# Patient Record
Sex: Female | Born: 1990 | Race: White | Hispanic: No | Marital: Single | Smoking: Never smoker
Health system: Southern US, Community
[De-identification: ages and names within clinical notes are randomized; demographics above are authoritative.]

## PROBLEM LIST (undated history)

## (undated) ENCOUNTER — Ambulatory Visit (HOSPITAL_COMMUNITY): Admission: EM | Payer: No Typology Code available for payment source

---

## 2020-01-20 ENCOUNTER — Emergency Department (HOSPITAL_COMMUNITY): Payer: No Typology Code available for payment source

## 2020-01-20 ENCOUNTER — Encounter (HOSPITAL_COMMUNITY): Payer: Self-pay

## 2020-01-20 ENCOUNTER — Emergency Department (HOSPITAL_COMMUNITY)
Admission: EM | Admit: 2020-01-20 | Discharge: 2020-01-20 | Disposition: A | Discharge status: Home / Self Care | Payer: No Typology Code available for payment source | Attending: Emergency Medicine | Admitting: Emergency Medicine

## 2020-01-20 ENCOUNTER — Other Ambulatory Visit: Payer: Self-pay

## 2020-01-20 DIAGNOSIS — N760 Acute vaginitis: Secondary | ICD-10-CM | POA: Insufficient documentation

## 2020-01-20 DIAGNOSIS — R1031 Right lower quadrant pain: Secondary | ICD-10-CM | POA: Diagnosis present

## 2020-01-20 DIAGNOSIS — B9689 Other specified bacterial agents as the cause of diseases classified elsewhere: Secondary | ICD-10-CM

## 2020-01-20 DIAGNOSIS — Z79899 Other long term (current) drug therapy: Secondary | ICD-10-CM | POA: Diagnosis not present

## 2020-01-20 DIAGNOSIS — N10 Acute tubulo-interstitial nephritis: Secondary | ICD-10-CM | POA: Insufficient documentation

## 2020-01-20 DIAGNOSIS — R1011 Right upper quadrant pain: Secondary | ICD-10-CM

## 2020-01-20 DIAGNOSIS — N12 Tubulo-interstitial nephritis, not specified as acute or chronic: Secondary | ICD-10-CM

## 2020-01-20 LAB — URINALYSIS, ROUTINE W REFLEX MICROSCOPIC
Bacteria, UA: NONE SEEN
Bilirubin Urine: NEGATIVE
Glucose, UA: NEGATIVE mg/dL
Ketones, ur: 20 mg/dL — AB
Nitrite: NEGATIVE
Protein, ur: 30 mg/dL — AB
Specific Gravity, Urine: 1.02 (ref 1.005–1.030)
pH: 6 (ref 5.0–8.0)

## 2020-01-20 LAB — CBC WITH DIFFERENTIAL/PLATELET
Abs Immature Granulocytes: 0.08 10*3/uL — ABNORMAL HIGH (ref 0.00–0.07)
Basophils Absolute: 0 10*3/uL (ref 0.0–0.1)
Basophils Relative: 0 %
Eosinophils Absolute: 0 10*3/uL (ref 0.0–0.5)
Eosinophils Relative: 0 %
HCT: 42 % (ref 36.0–46.0)
Hemoglobin: 13.8 g/dL (ref 12.0–15.0)
Immature Granulocytes: 1 %
Lymphocytes Relative: 14 %
Lymphs Abs: 1.9 10*3/uL (ref 0.7–4.0)
MCH: 32 pg (ref 26.0–34.0)
MCHC: 32.9 g/dL (ref 30.0–36.0)
MCV: 97.4 fL (ref 80.0–100.0)
Monocytes Absolute: 1.3 10*3/uL — ABNORMAL HIGH (ref 0.1–1.0)
Monocytes Relative: 10 %
Neutro Abs: 10.5 10*3/uL — ABNORMAL HIGH (ref 1.7–7.7)
Neutrophils Relative %: 75 %
Platelets: 152 10*3/uL (ref 150–400)
RBC: 4.31 MIL/uL (ref 3.87–5.11)
RDW: 12.5 % (ref 11.5–15.5)
WBC: 13.8 10*3/uL — ABNORMAL HIGH (ref 4.0–10.5)
nRBC: 0 % (ref 0.0–0.2)

## 2020-01-20 LAB — WET PREP, GENITAL
Sperm: NONE SEEN
Trich, Wet Prep: NONE SEEN
Yeast Wet Prep HPF POC: NONE SEEN

## 2020-01-20 LAB — I-STAT BETA HCG BLOOD, ED (MC, WL, AP ONLY): I-stat hCG, quantitative: <5 m[IU]/mL (ref <5)

## 2020-01-20 LAB — COMPREHENSIVE METABOLIC PANEL
ALT: 16 U/L (ref 0–44)
AST: 20 U/L (ref 15–41)
Albumin: 3.7 g/dL (ref 3.5–5.0)
Alkaline Phosphatase: 43 U/L (ref 38–126)
Anion gap: 8 (ref 5–15)
BUN: 13 mg/dL (ref 6–20)
CO2: 25 mmol/L (ref 22–32)
Calcium: 8 mg/dL — ABNORMAL LOW (ref 8.9–10.3)
Chloride: 101 mmol/L (ref 98–111)
Creatinine, Ser: 1.1 mg/dL — ABNORMAL HIGH (ref 0.44–1.00)
GFR calc Af Amer: >60 mL/min (ref >60)
GFR calc non Af Amer: >60 mL/min (ref >60)
Glucose, Bld: 105 mg/dL — ABNORMAL HIGH (ref 70–99)
Potassium: 3.6 mmol/L (ref 3.5–5.1)
Sodium: 134 mmol/L — ABNORMAL LOW (ref 135–145)
Total Bilirubin: 0.8 mg/dL (ref 0.3–1.2)
Total Protein: 6.8 g/dL (ref 6.5–8.1)

## 2020-01-20 LAB — LIPASE, BLOOD: Lipase: 40 U/L (ref 11–51)

## 2020-01-20 MED ORDER — ACETAMINOPHEN 325 MG PO TABS: 650.0000 mg | ORAL_TABLET | Freq: Once | ORAL | Status: DC

## 2020-01-20 MED ORDER — SODIUM CHLORIDE 0.9 % IV SOLN
2.0000 g | Freq: Once | INTRAVENOUS | Status: AC
  Administered 2020-01-20: 2 g via INTRAVENOUS
  Filled 2020-01-20: qty 20

## 2020-01-20 MED ORDER — MORPHINE SULFATE (PF) 4 MG/ML IV SOLN
4.0000 mg | Freq: Once | INTRAVENOUS | Status: AC
  Administered 2020-01-20: 08:00:00 4 mg via INTRAVENOUS
  Filled 2020-01-20: qty 1

## 2020-01-20 MED ORDER — DIPHENHYDRAMINE HCL 25 MG PO CAPS
25.0000 mg | ORAL_CAPSULE | Freq: Once | ORAL | Status: AC
  Administered 2020-01-20: 25 mg via ORAL
  Filled 2020-01-20: qty 1

## 2020-01-20 MED ORDER — CIPROFLOXACIN HCL 500 MG PO TABS: 500.0000 mg | ORAL_TABLET | Freq: Two times a day (BID) | ORAL | 0 refills | Status: AC

## 2020-01-20 MED ORDER — METRONIDAZOLE 500 MG PO TABS: 500.0000 mg | ORAL_TABLET | Freq: Two times a day (BID) | ORAL | 0 refills | Status: AC

## 2020-01-20 MED ORDER — SODIUM CHLORIDE 0.9 % IV BOLUS
1000.0000 mL | Freq: Once | INTRAVENOUS | Status: AC
  Administered 2020-01-20: 1000 mL via INTRAVENOUS

## 2020-01-20 MED ORDER — SODIUM CHLORIDE 0.9 % IV SOLN: Freq: Once | INTRAVENOUS | Status: AC

## 2020-01-20 MED ORDER — ACETAMINOPHEN 325 MG PO TABS
650.0000 mg | ORAL_TABLET | Freq: Once | ORAL | Status: AC
  Administered 2020-01-20: 650 mg via ORAL
  Filled 2020-01-20: qty 2

## 2020-01-20 MED ORDER — ONDANSETRON HCL 4 MG/2ML IJ SOLN
4.0000 mg | Freq: Once | INTRAMUSCULAR | Status: AC
  Administered 2020-01-20: 08:00:00 4 mg via INTRAVENOUS
  Filled 2020-01-20: qty 2

## 2020-01-20 MED ORDER — METOCLOPRAMIDE HCL 5 MG/ML IJ SOLN
10.0000 mg | Freq: Once | INTRAMUSCULAR | Status: AC
  Administered 2020-01-20: 10 mg via INTRAVENOUS
  Filled 2020-01-20: qty 2

## 2020-01-20 MED ORDER — IOHEXOL 300 MG/ML  SOLN
100.0000 mL | Freq: Once | INTRAMUSCULAR | Status: AC | PRN
  Administered 2020-01-20: 10:00:00 100 mL via INTRAVENOUS

## 2020-01-20 MED ORDER — SODIUM CHLORIDE (PF) 0.9 % IJ SOLN
INTRAMUSCULAR | Status: AC
  Filled 2020-01-20: qty 50

## 2020-01-20 MED ORDER — ONDANSETRON 4 MG PO TBDP: 4.0000 mg | ORAL_TABLET | Freq: Three times a day (TID) | ORAL | 0 refills | Status: AC | PRN

## 2020-01-20 NOTE — ED Triage Notes (Signed)
Pt c/o right lower abdominal pain, headache and fever since yesterday afternoon.

## 2020-01-20 NOTE — ED Provider Notes (Addendum)
[facility] DEPT Provider Note   CSN: [id number] Arrival date & time: 01/20/20  1751     History Chief Complaint  Patient presents with   Abdominal Pain   Headache   Fever    Nicole Burton is a 29 y.o. female.  HPI  Patient is a 29 year old female with no significant past medical history, no history of abdominal surgeries, on no medications.  Patient presents today for right lower abdominal pain that began yesterday and is worsened since that time.  She states that it is associated with nausea vomiting X1 which is nonbloody nonbilious, decreased appetite and some dysuria.  She states that she initially thought it was a urinary tract infection however she states that it seems worse now.  She states that the pain in her right lower quadrant seems to radiate to her low back.  She is uncertain whether eating makes his pain better or worse that she has not eaten anything since yesterday morning.  She endorses a mild frontal headache as well as fever and chills.  She states that she has not taken any Tylenol ibuprofen today.  She did take some yesterday which gave her some mild relief.  Denies any blood in her stool, diarrhea or constipation, denies any pelvic pain, vaginal bleeding, vaginal discharge.  States her last menstrual period ended several days     History reviewed. No pertinent past medical history.  There are no problems to display for this patient.   History reviewed. No pertinent surgical history.   OB History   No obstetric history on file.     History reviewed. No pertinent family history.  Social History   Tobacco Use   Smoking status: Never Smoker   Smokeless tobacco: Never Used  Substance Use Topics   Alcohol use: Not on file   Drug use: Not on file    Home Medications Prior to Admission medications   Medication Sig Start Date End Date Taking? Authorizing Provider  ibuprofen (ADVIL) 200 MG tablet Take 600-800  mg by mouth every 6 (six) hours as needed for fever or moderate pain.   Yes [provider]  Multiple Vitamin (MULTIVITAMIN) tablet Take 1 tablet by mouth daily.   Yes [provider]  norgestimate-ethinyl estradiol (ORTHO-CYCLEN) 0.25-35 MG-MCG tablet Take 1 tablet by mouth daily.   Yes [provider]  ondansetron (ZOFRAN) 4 MG tablet Take 4 mg by mouth every 8 (eight) hours as needed for nausea or vomiting.   Yes [provider]  ciprofloxacin (CIPRO) 500 MG tablet Take 1 tablet (500 mg total) by mouth every 12 (twelve) hours for 10 days. 01/20/20 01/30/20  Tedd Sias, PA  metroNIDAZOLE (FLAGYL) 500 MG tablet Take 1 tablet (500 mg total) by mouth 2 (two) times daily. 01/20/20   Kanai Hilger S, PA  ondansetron (ZOFRAN ODT) 4 MG disintegrating tablet Take 1 tablet (4 mg total) by mouth every 8 (eight) hours as needed for nausea or vomiting. 01/20/20   Tedd Sias, PA    Allergies    Patient has no known allergies.  Review of Systems   Review of Systems  Constitutional: Negative for chills and fever.  HENT: Negative for congestion.   Eyes: Negative for pain.  Respiratory: Negative for cough and shortness of breath.   Cardiovascular: Negative for chest pain and leg swelling.  Gastrointestinal: Positive for abdominal pain, nausea and vomiting. Negative for diarrhea.  Genitourinary: Positive for dysuria. Negative for flank pain.  Musculoskeletal:  Negative for myalgias.  Skin: Negative for rash.  Neurological: Positive for headaches. Negative for dizziness.    Physical Exam Updated Vital Signs BP 96/61    Pulse 74    Temp 100.1 F (37.8 C) Comment: will recheck once home   Resp 16    LMP 01/17/2020 (Exact Date)    SpO2 97%   Physical Exam Vitals and nursing note reviewed.  Constitutional:      Comments: Pleasant, 29 year old female who is pleasant   HENT:     Head: Normocephalic and atraumatic.     Nose: Nose normal.     Mouth/Throat:      Mouth: Mucous membranes are dry.  Eyes:     General: No scleral icterus. Cardiovascular:     Rate and Rhythm: Normal rate and regular rhythm.     Pulses: Normal pulses.     Heart sounds: Normal heart sounds.  Pulmonary:     Effort: Pulmonary effort is normal. No respiratory distress.     Breath sounds: No wheezing.  Abdominal:     Palpations: Abdomen is soft.     Tenderness: There is abdominal tenderness. There is right CVA tenderness. There is no left CVA tenderness, guarding or rebound. Positive signs include McBurney's sign. Negative signs include Murphy's sign.     Comments: RLQ and RUQ TTP  Genitourinary:    Comments: Vulva without lesions or abnormality Vaginal canal without abnormal discharge or lesion Cervix appears normal, is closed No adnexal tenderness or CMT Musculoskeletal:     Cervical back: Normal range of motion.     Right lower leg: No edema.     Left lower leg: No edema.  Skin:    General: Skin is warm and dry.     Capillary Refill: Capillary refill takes less than 2 seconds.  Neurological:     Mental Status: She is alert. Mental status is at baseline.  Psychiatric:        Mood and Affect: Mood normal.        Behavior: Behavior normal.     ED Results / Procedures / Treatments   Labs (all labs ordered are listed, but only abnormal results are displayed) Labs Reviewed  WET PREP, GENITAL - Abnormal; Notable for the following components:      Result Value   Clue Cells Wet Prep HPF POC PRESENT (*)    WBC, Wet Prep HPF POC MANY (*)    All other components within normal limits  CBC WITH DIFFERENTIAL/PLATELET - Abnormal; Notable for the following components:   WBC 13.8 (*)    Neutro Abs 10.5 (*)    Monocytes Absolute 1.3 (*)    Abs Immature Granulocytes 0.08 (*)    All other components within normal limits  COMPREHENSIVE METABOLIC PANEL - Abnormal; Notable for the following components:   Sodium 134 (*)    Glucose, Bld 105 (*)    Creatinine, Ser 1.10 (*)      Calcium 8.0 (*)    All other components within normal limits  URINALYSIS, ROUTINE W REFLEX MICROSCOPIC - Abnormal; Notable for the following components:   Hgb urine dipstick SMALL (*)    Ketones, ur 20 (*)    Protein, ur 30 (*)    Leukocytes,Ua MODERATE (*)    All other components within normal limits  LIPASE, BLOOD  I-STAT BETA HCG BLOOD, ED ([facility], [facility], [facility] ONLY)  GC/CHLAMYDIA PROBE AMP ([place]) NOT AT [facility]    EKG None  Radiology CT ABDOMEN PELVIS W CONTRAST  Result Date: 01/20/2020 CLINICAL DATA:  Lower abdominal pain EXAM: CT ABDOMEN AND PELVIS WITH CONTRAST TECHNIQUE: Multidetector CT imaging of the abdomen and pelvis was performed using the standard protocol following bolus administration of intravenous contrast. CONTRAST:  100mL OMNIPAQUE IOHEXOL 300 MG/ML  SOLN COMPARISON:  None. FINDINGS: Lower chest: There are small pleural effusions bilaterally with bibasilar atelectasis. Lung bases otherwise are clear. Hepatobiliary: No focal liver lesions are appreciable. The gallbladder wall is not appreciably thickened. There is no biliary duct dilatation. Pancreas: No pancreatic mass or inflammatory focus. Spleen: Spleen measures 13.2 x 11.8 x 6.7 cm with a measured splenic volume of 522 cubic cm. No focal splenic lesions evident. Adrenals/Urinary Tract: Adrenals bilaterally appear normal. There is an area of decreased enhancement with somewhat masslike appearance in the upper pole the right kidney measuring 4.0 x 2.5 x 2.8 cm. A somewhat similar appearing area decreased enhancement in the upper pole of the left kidney measures 2.5 x 2.0 by 2.1 cm. There is no hydronephrosis on either side. There is no appreciable perinephric stranding or thickening on either side. There is no evident renal or ureteral calculus on either side. There is mild thickening of the wall of the urinary bladder diffusely. Stomach/Bowel: There is fairly diffuse stool throughout the colon. There are several loops of bowel  in the pelvic region which are fluid-filled. There is no appreciable bowel wall thickening. There is no demonstrable bowel obstruction. The terminal ileal region appears overall unremarkable. No free air or portal venous air. Vascular/Lymphatic: No abdominal aortic aneurysm. No arterial vascular lesions are evident. Major venous structures appear patent. No adenopathy is appreciable in the abdomen or pelvis. Reproductive: The uterus is anteverted.  No pelvic mass evident. Other: There is ascites in the pelvis. The appendix appears unremarkable without demonstrable appendiceal region inflammatory change. No abscess apart from the lesions in the kidneys evident in the abdomen or pelvis. Musculoskeletal: No blastic or lytic bone lesions. No intramuscular or abdominal wall lesions are evident. IMPRESSION: 1. Foci of decreased attenuation in the upper pole of each kidney, an appearance felt to be most indicative of bilateral upper pole bacterial nephritis/acute lobar nephronia. There may be developing renal abscess on each side, particularly on the right. No air is seen in either of these areas of decreased attenuation, however. No associated perinephric stranding or thickening. No hydronephrosis on either side. No renal or ureteral calculus on either side. 2. Urinary bladder wall thickening consistent with a degree of cystitis. 3.  Ascites noted in the pelvic region. 4. Appendix appears normal. No bowel obstruction. Fluid is noted in loops of pelvic small bowel which may be indicative of a degree of underlying enteritis or potential ileus secondary to the renal and urinary bladder lesions. 5.  Prominent spleen without focal splenic lesion evident. 6.  Small pleural effusions bilaterally with bibasilar atelectasis. Electronically Signed   By: Bretta BangWilliam  Woodruff III M.D.   On: 01/20/2020 10:42    Procedures Procedures (including critical care time)  Medications Ordered in ED Medications  sodium chloride (PF) 0.9 %  injection (has no administration in time range)  0.9 %  sodium chloride infusion ( Intravenous Stopped 01/20/20 0942)  morphine 4 MG/ML injection 4 mg (4 mg Intravenous Given 01/20/20 0810)  ondansetron (ZOFRAN) injection 4 mg (4 mg Intravenous Given 01/20/20 0810)  acetaminophen (TYLENOL) tablet 650 mg (650 mg Oral Given 01/20/20 0948)  metoCLOPramide (REGLAN) injection 10 mg (10 mg Intravenous Given 01/20/20 0948)  diphenhydrAMINE (BENADRYL) capsule 25 mg (  25 mg Oral Given 01/20/20 0948)  sodium chloride 0.9 % bolus 1,000 mL (0 mLs Intravenous Stopped 01/20/20 1047)  iohexol (OMNIPAQUE) 300 MG/ML solution 100 mL (100 mLs Intravenous Contrast Given 01/20/20 1022)  cefTRIAXone (ROCEPHIN) 2 g in sodium chloride 0.9 % 100 mL IVPB (0 g Intravenous Stopped 01/20/20 1306)    ED Course  I have reviewed the triage vital signs and the nursing notes.  Pertinent labs & imaging results that were available during my care of the patient were reviewed by me and considered in my medical decision making (see chart for details).  Patient is a 29 year old female with no severe past medical history presented today with abdominal pain   Differential includes appendicitis VS pyelonephritis he has UTI VS cholecystitis vs ectopic pregnancy vs kidney stone  Clinical Course as of Jan 19 1326  Tue Jan 20, 2020  1014 Leukocytosis which is neutrophil predominant.  This is consistent with appendicitis.  As she has right lower quadrant abdominal pain I will obtain contrast CT of abdomen pelvis to rule out appendicitis.   [WF]  1015 CMP is grossly unremarkable. Creatinine only mildly elevated but no baseline to compare to.    [WF]  1016 Urinalysis shows moderate leukocytes and small hemoglobin.  There are ketones and protein present likely due to patient's. She does have some dysuria. There is no bacteria present.    [WF]  1017 Negative I-stat hCG.    [WF]  1018 Lipase WNLs    [WF]    Clinical Course User Index [WF] Gailen Shelter, [place]   I discussed this case with my attending physician who cosigned this note including patient's presenting symptoms, physical exam, and planned diagnostics and interventions. Attending physician stated agreement with plan or made changes to plan which were implemented.    CT scan independently viewed by myself.  As there is concern for early abscess formation in bilateral kidneys will consult urology.  Discussed case with Dr. Benard Rink his expert consult.  He recommends 10 days of 500 mg twice daily ciprofloxacin.  Strict return precautions and states that CT scan is consistent with classic pyelonephritis.  Patient is tolerating p.o. without difficulty and pain is under control.  Will discharge at this time.  She is given strict return precautions.  She is understanding of plan.   Given one dose of rocephin here in ED IV.  She was also found to have bacterial vaginosis will be treated with Flagyl for 1 week.  She is given Zofran for nausea.  Tylenol and ibuprofen for pain.  The medical records were personally reviewed by myself. I personally reviewed all lab results and interpreted all imaging studies and either concurred with their official read or contacted radiology for clarification.   This patient appears reasonably screened and I doubt any other medical condition requiring further workup, evaluation, or treatment in the ED at this time prior to discharge.   Patient's vitals are WNL apart from vital sign abnormalities discussed above, patient is in NAD, and able to ambulate in the ED at their baseline and able to tolerate PO.  Pain has been managed or a plan has been made for home management and has no complaints prior to discharge. Patient is comfortable with above plan and for discharge at this time. All questions were answered prior to disposition. Results from the ER workup discussed with the patient face to face and all questions answered to the best of my ability. The  patient is safe for  discharge with strict return precautions. Patient appears safe for discharge with appropriate follow-up. Conveyed my impression with the patient and they voiced understanding and are agreeable to plan.   An After Visit Summary was printed and given to the patient.  Portions of this note were generated with Scientist, clinical (histocompatibility and immunogenetics). Dictation errors may occur despite best attempts at proofreading.   MDM Rules/Calculators/A&P                       Final Clinical Impression(s) / ED Diagnoses Final diagnoses:  BV (bacterial vaginosis)  Pyelonephritis  Right lower quadrant abdominal pain  Right upper quadrant abdominal pain    Rx / DC Orders ED Discharge Orders         Ordered    metroNIDAZOLE (FLAGYL) 500 MG tablet  2 times daily     01/20/20 1142    ciprofloxacin (CIPRO) 500 MG tablet  Every 12 hours     01/20/20 1142    ondansetron (ZOFRAN ODT) 4 MG disintegrating tablet  Every 8 hours PRN     01/20/20 1142           Gailen Shelter, [place] 01/20/20 1146    Solon Augusta [place], [place] 01/20/20 1328    Linwood Dibbles, MD 01/21/20 [phone]

## 2020-01-20 NOTE — Discharge Instructions (Addendum)
Please take ciprofloxacin twice daily for the next 10 days.  Please return to ED if you have any worsening symptoms, or if you are unable to tolerate fluids/food.   I have provided you with Zofran to use for nausea as needed.  I will dissolve under your tongue please also drink plenty of water to stay hydrated.  You also were found incidentally to have bacterial vaginosis.  I have sent a prescription for Flagyl to your pharmacy for you.  As reminder please do not drink any alcohol with this medication as it would cause you to forcefully vomit.  I have provided you with urology follow-up if needed.  However I believe your symptoms will improve with antibiotics.

## 2020-01-21 LAB — GC/CHLAMYDIA PROBE AMP (~~LOC~~) NOT AT ARMC
Chlamydia: NEGATIVE
Neisseria Gonorrhea: NEGATIVE

## 2021-07-02 IMAGING — CT CT ABD-PELV W/ CM
2 of 4 series · 15 of 46 positions shown, 17 images · IV contrast (APPLIED)
Comparison: None.

CLINICAL DATA: Lower abdominal pain

EXAM:
CT ABDOMEN AND PELVIS WITH CONTRAST
TECHNIQUE: Multidetector CT imaging of the abdomen and pelvis was performed
using the standard protocol following bolus administration of
intravenous contrast.
CONTRAST:  100mL OMNIPAQUE IOHEXOL 300 MG/ML  SOLN

[Series 2: axial st · axial · 0.83mm/px · z∈[-223,+227]mm · 12 of 101 slices shown, 14 images]
[im 6/101  soft-tissue]
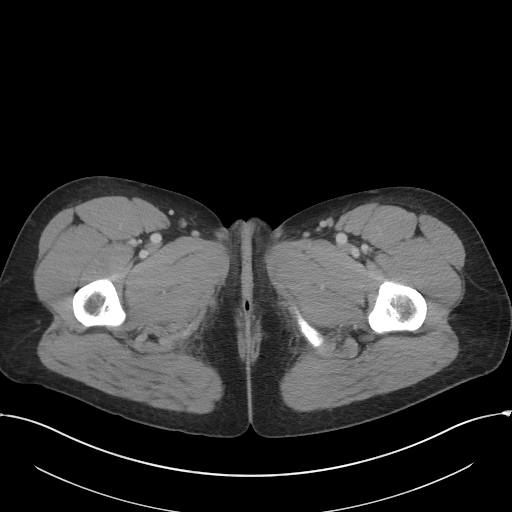
[im 6/101  bone]
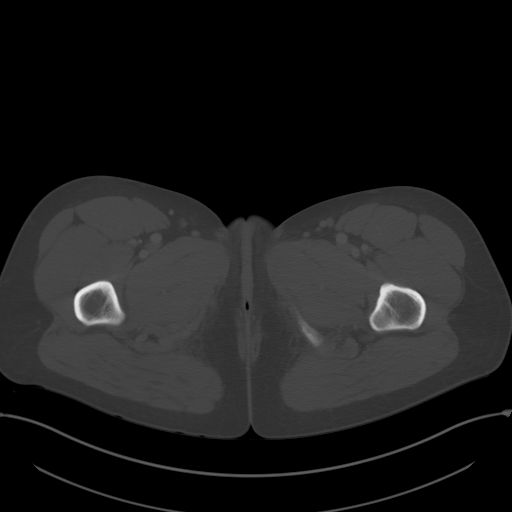
[im 16/101  soft-tissue]
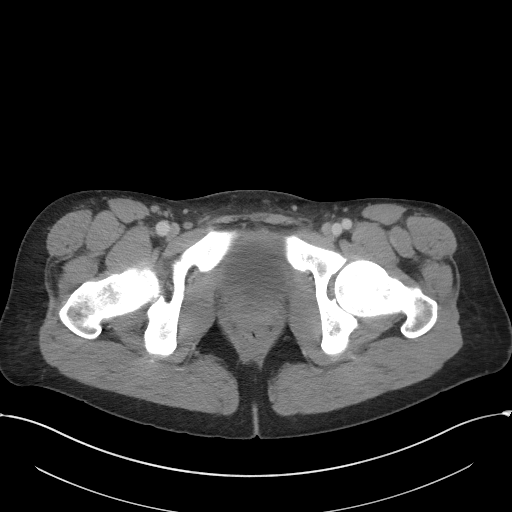
[im 21/101  soft-tissue]
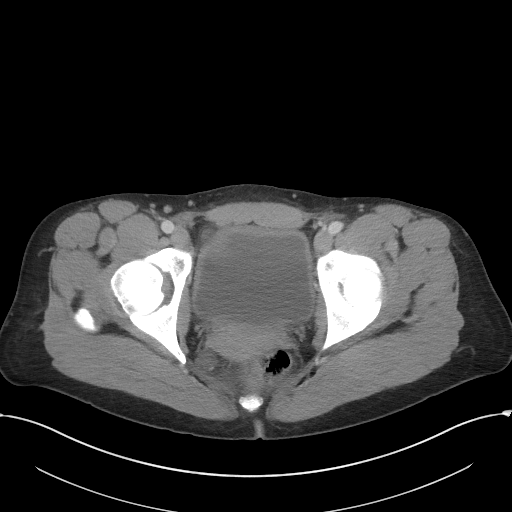
[im 31/101  soft-tissue]
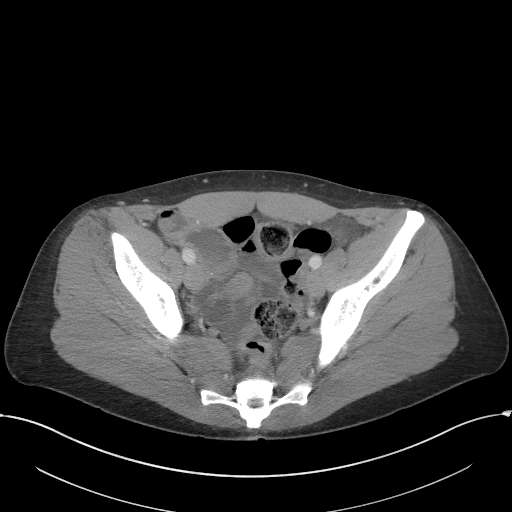
[im 41/101  soft-tissue]
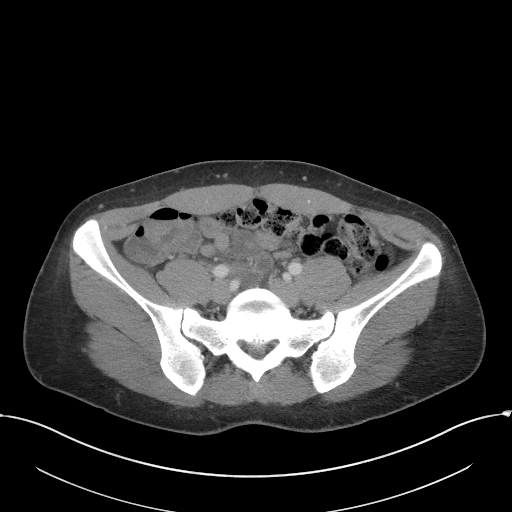
[im 46/101  soft-tissue]
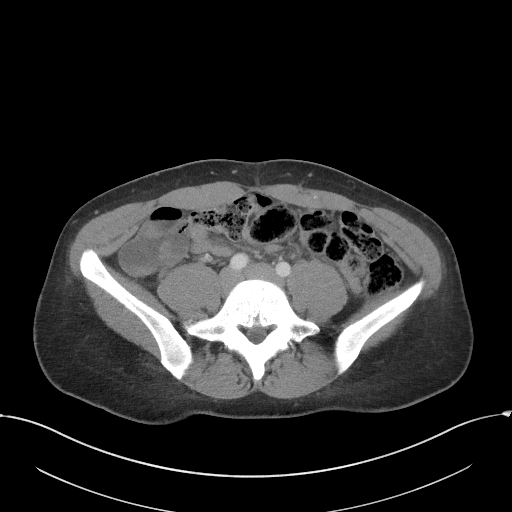
[im 56/101  soft-tissue]
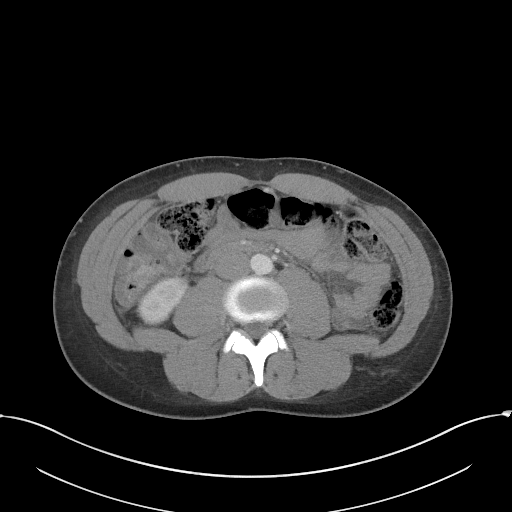
[im 61/101  soft-tissue]
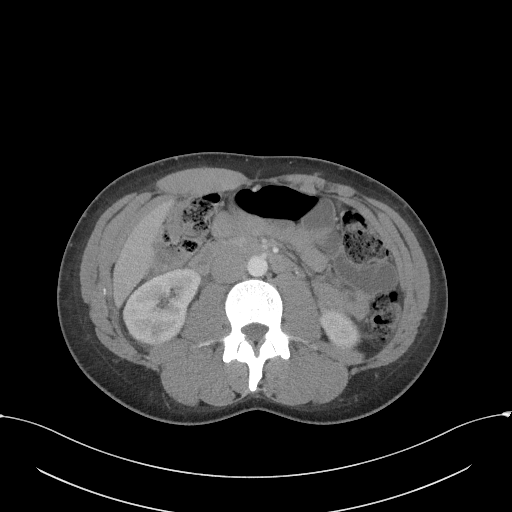
[im 71/101  soft-tissue]
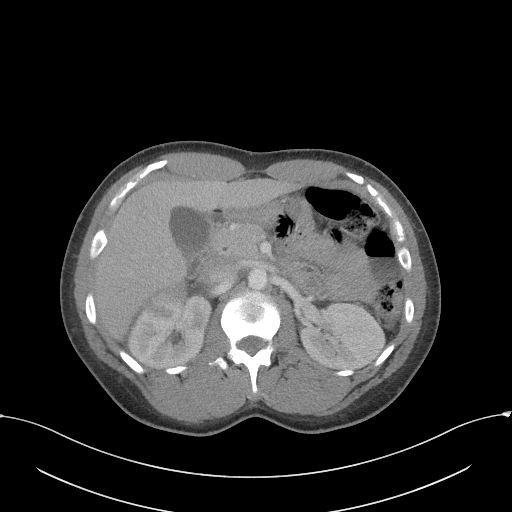
[im 71/101  bone]
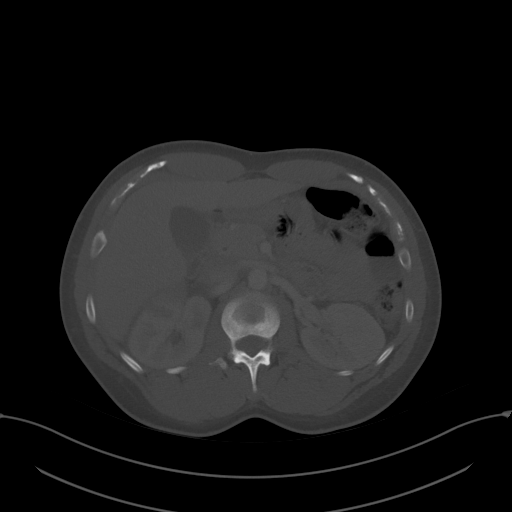
[im 81/101  soft-tissue]
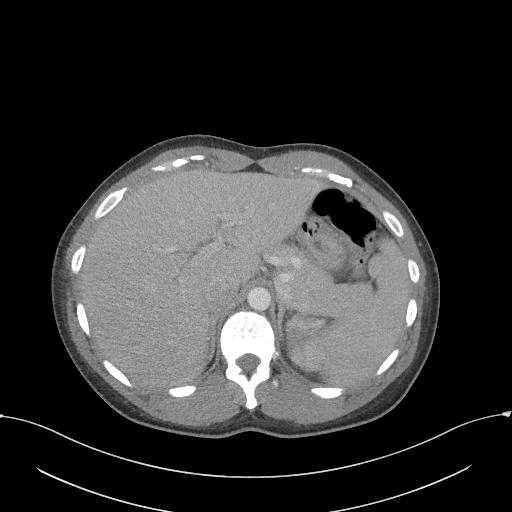
[im 86/101  soft-tissue]
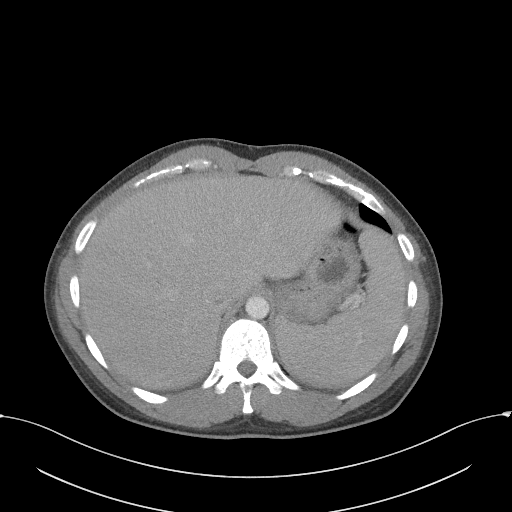
[im 96/101  soft-tissue]
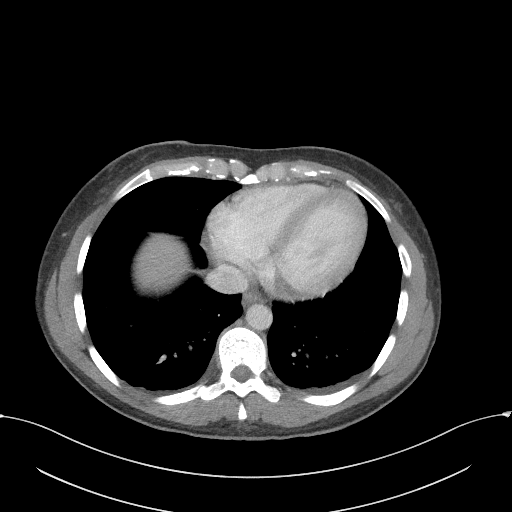

[Series 4: coronal st · coronal · 0.72mm/px · 3 of 84 slices shown]
[im 28/84  soft-tissue]
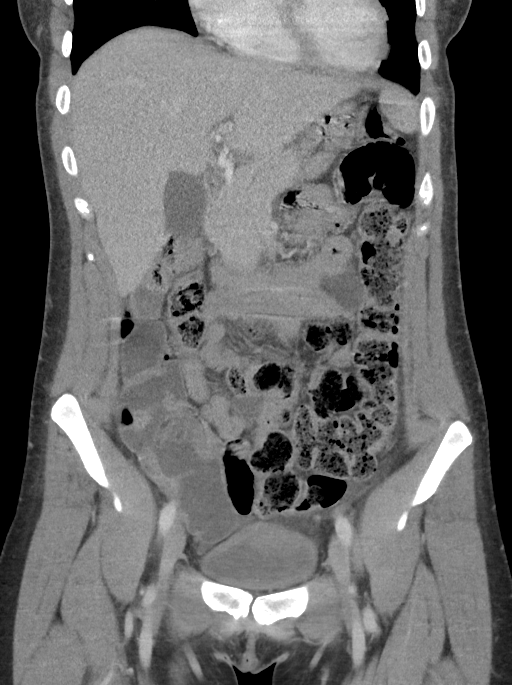
[im 37/84  soft-tissue]
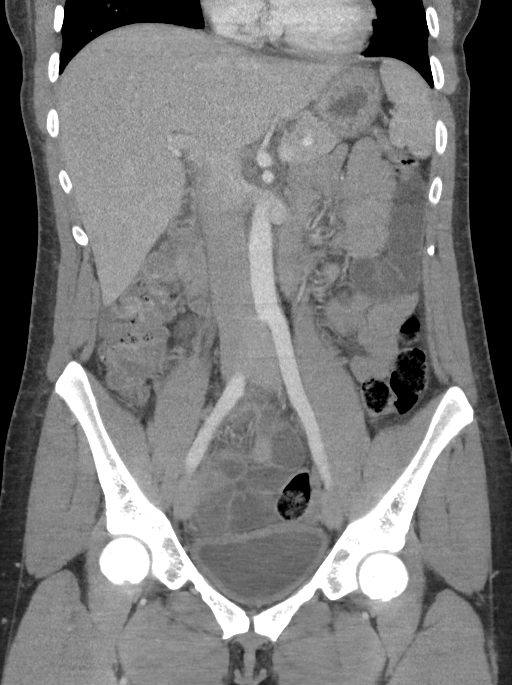
[im 47/84  soft-tissue]
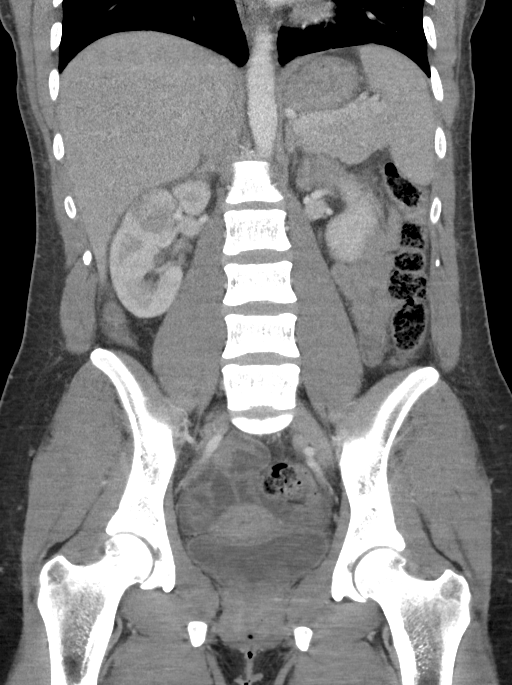

[15 of 46 positions shown; findings below may reference images not displayed]

FINDINGS: Lower chest: There are small pleural effusions bilaterally with
bibasilar atelectasis. Lung bases otherwise are clear.

Hepatobiliary: No focal liver lesions are appreciable. The
gallbladder wall is not appreciably thickened. There is no biliary
duct dilatation.

Pancreas: No pancreatic mass or inflammatory focus.

Spleen: Spleen measures 13.2 x 11.8 x 6.7 cm with a measured splenic
volume of 522 cubic cm. No focal splenic lesions evident.

Adrenals/Urinary Tract: Adrenals bilaterally appear normal. There is
an area of decreased enhancement with somewhat masslike appearance
in the upper pole the right kidney measuring 4.0 x 2.5 x 2.8 cm. A
somewhat similar appearing area decreased enhancement in the upper
pole of the left kidney measures 2.5 x 2.0 by 2.1 cm. There is no
hydronephrosis on either side. There is no appreciable perinephric
stranding or thickening on either side. There is no evident renal or
ureteral calculus on either side. There is mild thickening of the
wall of the urinary bladder diffusely.

Stomach/Bowel: There is fairly diffuse stool throughout the colon.
There are several loops of bowel in the pelvic region which are
fluid-filled. There is no appreciable bowel wall thickening. There
is no demonstrable bowel obstruction. The terminal ileal region
appears overall unremarkable. No free air or portal venous air.

Vascular/Lymphatic: No abdominal aortic aneurysm. No arterial
vascular lesions are evident. Major venous structures appear patent.
No adenopathy is appreciable in the abdomen or pelvis.

Reproductive: The uterus is anteverted.  No pelvic mass evident.

Other: There is ascites in the pelvis. The appendix appears
unremarkable without demonstrable appendiceal region inflammatory
change. No abscess apart from the lesions in the kidneys evident in
the abdomen or pelvis.

Musculoskeletal: No blastic or lytic bone lesions. No intramuscular
or abdominal wall lesions are evident.
IMPRESSION: 1. Foci of decreased attenuation in the upper pole of each kidney,
an appearance felt to be most indicative of bilateral upper pole
bacterial nephritis/acute lobar nephronia. There may be developing
renal abscess on each side, particularly on the right. No air is
seen in either of these areas of decreased attenuation, however. No
associated perinephric stranding or thickening. No hydronephrosis on
either side. No renal or ureteral calculus on either side.

2. Urinary bladder wall thickening consistent with a degree of
cystitis.

3.  Ascites noted in the pelvic region.

4. Appendix appears normal. No bowel obstruction. Fluid is noted in
loops of pelvic small bowel which may be indicative of a degree of
underlying enteritis or potential ileus secondary to the renal and
urinary bladder lesions.

5.  Prominent spleen without focal splenic lesion evident.

6.  Small pleural effusions bilaterally with bibasilar atelectasis.
